# Patient Record
Sex: Female | Born: 1985 | Hispanic: Yes | Marital: Single | Smoking: Never smoker
Health system: Southern US, Community
[De-identification: ages and names within clinical notes are randomized; demographics above are authoritative.]

---

## 2017-07-01 ENCOUNTER — Emergency Department (HOSPITAL_COMMUNITY): Payer: BLUE CROSS/BLUE SHIELD

## 2017-07-01 ENCOUNTER — Encounter (HOSPITAL_COMMUNITY): Payer: Self-pay | Admitting: Emergency Medicine

## 2017-07-01 ENCOUNTER — Emergency Department (HOSPITAL_COMMUNITY)
Admission: EM | Admit: 2017-07-01 | Discharge: 2017-07-01 | Disposition: A | Discharge status: Home / Self Care | Payer: BLUE CROSS/BLUE SHIELD | Attending: Emergency Medicine | Admitting: Emergency Medicine

## 2017-07-01 DIAGNOSIS — R079 Chest pain, unspecified: Secondary | ICD-10-CM | POA: Diagnosis present

## 2017-07-01 DIAGNOSIS — R002 Palpitations: Secondary | ICD-10-CM | POA: Insufficient documentation

## 2017-07-01 DIAGNOSIS — M79604 Pain in right leg: Secondary | ICD-10-CM | POA: Insufficient documentation

## 2017-07-01 DIAGNOSIS — R000 Tachycardia, unspecified: Secondary | ICD-10-CM

## 2017-07-01 LAB — RAPID URINE DRUG SCREEN, HOSP PERFORMED
Amphetamines: NOT DETECTED
BARBITURATES: NOT DETECTED
BENZODIAZEPINES: NOT DETECTED
COCAINE: NOT DETECTED
Opiates: NOT DETECTED
TETRAHYDROCANNABINOL: NOT DETECTED

## 2017-07-01 LAB — I-STAT BETA HCG BLOOD, ED (MC, WL, AP ONLY)

## 2017-07-01 LAB — BASIC METABOLIC PANEL
Anion gap: 6 (ref 5–15)
BUN: 11 mg/dL (ref 6–20)
CALCIUM: 9 mg/dL (ref 8.9–10.3)
CHLORIDE: 107 mmol/L (ref 101–111)
CO2: 25 mmol/L (ref 22–32)
CREATININE: 0.82 mg/dL (ref 0.44–1.00)
GFR calc non Af Amer: >60 mL/min (ref >60)
Glucose, Bld: 130 mg/dL — ABNORMAL HIGH (ref 65–99)
Potassium: 4 mmol/L (ref 3.5–5.1)
SODIUM: 138 mmol/L (ref 135–145)

## 2017-07-01 LAB — CBC
HCT: 40.2 % (ref 36.0–46.0)
Hemoglobin: 13.1 g/dL (ref 12.0–15.0)
MCH: 27.7 pg (ref 26.0–34.0)
MCHC: 32.6 g/dL (ref 30.0–36.0)
MCV: 85 fL (ref 78.0–100.0)
PLATELETS: 323 10*3/uL (ref 150–400)
RBC: 4.73 MIL/uL (ref 3.87–5.11)
RDW: 14.6 % (ref 11.5–15.5)
WBC: 6.6 10*3/uL (ref 4.0–10.5)

## 2017-07-01 LAB — I-STAT TROPONIN, ED: TROPONIN I, POC: 0 ng/mL (ref 0.00–0.08)

## 2017-07-01 LAB — TSH: TSH: 0.973 u[IU]/mL (ref 0.350–4.500)

## 2017-07-01 MED ORDER — ACETAMINOPHEN 500 MG PO TABS
1000.0000 mg | ORAL_TABLET | Freq: Once | ORAL | Status: AC
  Administered 2017-07-01: 1000 mg via ORAL
  Filled 2017-07-01: qty 2

## 2017-07-01 MED ORDER — IOPAMIDOL (ISOVUE-370) INJECTION 76%
INTRAVENOUS | Status: AC
  Filled 2017-07-01: qty 100

## 2017-07-01 MED ORDER — GUAIFENESIN-CODEINE 100-10 MG/5ML PO SYRP: 5.0000 mL | ORAL_SOLUTION | Freq: Three times a day (TID) | ORAL | 0 refills | Status: AC | PRN

## 2017-07-01 MED ORDER — METOPROLOL TARTRATE 5 MG/5ML IV SOLN
5.0000 mg | Freq: Once | INTRAVENOUS | Status: AC
  Administered 2017-07-01: 5 mg via INTRAVENOUS
  Filled 2017-07-01: qty 5

## 2017-07-01 MED ORDER — IOPAMIDOL (ISOVUE-370) INJECTION 76%
100.0000 mL | Freq: Once | INTRAVENOUS | Status: AC | PRN
  Administered 2017-07-01: 100 mL via INTRAVENOUS

## 2017-07-01 MED ORDER — BENZONATATE 100 MG PO CAPS: 100.0000 mg | ORAL_CAPSULE | Freq: Three times a day (TID) | ORAL | 0 refills | Status: AC

## 2017-07-01 NOTE — ED Triage Notes (Signed)
Patient here from home with complaints of central chest pain non radiating and palpations . Denies n/v.

## 2017-07-01 NOTE — ED Provider Notes (Signed)
[facility]-EMERGENCY DEPT Provider Note   CSN: [id number] Arrival date & time: 07/01/17  0606     History   Chief Complaint Chief Complaint  Patient presents with  . Chest Pain  . Palpitations    HPI Alexis MaceMelissa Manning is a 32 y.o. female.  HPI   [name] is a 32 year old female with a history of palpitations, panic attacks and obesity who presents to the emergency department for evaluation of palpitations and chest pain.  Patient states that she developed cough and sneezing yesterday, last night took Alka-Seltzer cold and flu.  States that she has total body aches and chills as well. At about 5 AM this morning she woke up feeling as if her heart was beating out of her chest.  She also has associated left-sided chest pain below the left breast.  Pain feels like a "pinprick" sensation and does not radiate.  She rates pain is about 3/10 in severity and comes and goes. Her pain is worsened when she takes a deep breath. Also endorses right leg pain for the past two days, denies swelling or recent injury. She denies history of DVT/PE, exogenous estrogen use, long periods of immobility or recent surgery, hemoptysis.   Patient also endorses some shortness of breath and lightheadedness. She feels that her bilateral hands are "tingly," but denies loss of sensation. She denies headache, weakness, blurred vision, abdominal pain, N/V, diarrhea, dysuria, syncope.  Denies any recent life stressors. States that [place] she was worked up for palpitations and had a negative echocardiogram.  She states that she has never been on medication for palpitations.  Denies history of heart problems other than occasional palpitations. She denies recent drug use.   History reviewed. No pertinent past medical history.  There are no active problems to display for this patient.   History reviewed. No pertinent surgical history.  OB History    No data available        Home Medications    Prior to Admission medications   Not on File    Family History No family history on file.  Social History Social History   Tobacco Use  . Smoking status: Never Smoker  . Smokeless tobacco: Never Used  Substance Use Topics  . Alcohol use: Not on file  . Drug use: Not on file     Allergies   Patient has no allergy information on record.   Review of Systems Review of Systems  Constitutional: Positive for chills, fatigue and fever.  Eyes: Negative for visual disturbance.  Respiratory: Positive for cough and shortness of breath.   Cardiovascular: Positive for chest pain and palpitations. Negative for leg swelling.  Gastrointestinal: Negative for abdominal pain, nausea and vomiting.  Genitourinary: Negative for difficulty urinating and dysuria.  Musculoskeletal: Positive for myalgias (right leg pain).  Skin: Negative for rash.  Neurological: Positive for light-headedness. Negative for weakness, numbness and headaches.  Psychiatric/Behavioral: The patient is not nervous/anxious.      Physical Exam Updated Vital Signs BP (!) 137/96   Pulse (!) 115   Temp 100.2 F (37.9 C) (Oral)   Resp 17   Ht 5\' 2"  (1.575 m)   Wt 117.9 kg (260 lb)   LMP 06/27/2017   SpO2 100%   BMI 47.55 kg/m   Physical Exam  Constitutional: She is oriented to person, place, and time. She appears well-developed and well-nourished. No distress.  HENT:  Head: Normocephalic and atraumatic.  Mouth/Throat: Oropharynx is clear  and moist. No oropharyngeal exudate.  Eyes: Conjunctivae are normal. Pupils are equal, round, and reactive to light. Right eye exhibits no discharge. Left eye exhibits no discharge.  Neck: Normal range of motion. Neck supple.  Cardiovascular: Intact distal pulses. Exam reveals no friction rub.  No murmur heard. Regular rhythm, tachycardic.   Pulmonary/Chest: Effort normal and breath sounds normal. No stridor. No respiratory distress. She has no  wheezes. She has no rales. She exhibits no tenderness.  Abdominal: Soft. Bowel sounds are normal. There is no tenderness. There is no guarding.  Musculoskeletal: Normal range of motion.  No leg swelling or tenderness on exam. DP pulses 2+ bilaterally. Gait normal in balance and coordination.   Neurological: She is alert and oriented to person, place, and time. Coordination normal.  Distal sensation to light/sharp touch intact in bilateral UE.   Skin: Skin is warm and dry. Capillary refill takes less than 2 seconds. She is not diaphoretic.  Psychiatric: She has a normal mood and affect. Her behavior is normal.  Nursing note and vitals reviewed.    ED Treatments / Results  Labs (all labs ordered are listed, but only abnormal results are displayed) Labs Reviewed  CBC  BASIC METABOLIC PANEL  I-STAT TROPONIN, ED  I-STAT BETA HCG BLOOD, ED ([facility], [facility], [facility] ONLY)    EKG  EKG Interpretation  Date/Time:  Saturday July 01 2017 06:21:30 EST Ventricular Rate:  153 PR Interval:    QRS Duration: 79 QT Interval:  263 QTC Calculation: 420 R Axis:   75 Text Interpretation:  Sinus tachycardia Minimal ST depression, lateral leads - probably rate-related No old tracing to compare Confirmed by Dione Booze ([id number]) on 07/01/2017 6:27:46 AM       Radiology Dg Chest 2 View  Result Date: 07/01/2017 CLINICAL DATA:  Initial evaluation for acute chest pain. EXAM: CHEST  2 VIEW COMPARISON:  None. FINDINGS: The cardiac and mediastinal silhouettes are stable in size and contour, and remain within normal limits. The lungs are normally inflated. No airspace consolidation, pleural effusion, or pulmonary edema is identified. There is no pneumothorax. No acute osseous abnormality identified. IMPRESSION: No active cardiopulmonary disease. Electronically Signed   By: Rise Mu M.D.   On: 07/01/2017 06:46    Procedures Procedures (including critical care time)  Medications Ordered in ED Medications -  No data to display   Initial Impression / Assessment and Plan / ED Course  I have reviewed the triage vital signs and the nursing notes.  Pertinent labs & imaging results that were available during my care of the patient were reviewed by me and considered in my medical decision making (see chart for details).  Clinical Course as of Jul 01 905  Sat Jul 01, 2017  0905 After IV lopressor patient's pulse 101. She states her chest pain is about the same as previous.   [ES]    Clinical Course User Index [ES] Kellie Shropshire, PA-C   Patient presents with palpitations and chest pain. Recent sickness with cough, myalgias, fever/chills. States that she has a history of panic attacks and palpitations in the past, isn't sure if this is similar. Denies previous cardiac history.   On exam she is tachycardic to 131bpm, regular rhythm. She does not have any calf tenderness or swelling on exam, but states that she has had right leg tenderness for the past two days. No history of DVT/PE.   EKG shows sinus tachycardia. Troponin negative. BhCG negative. CBC and BMP unremarkable. TSH WNL. Rapid  UDS negative.    Given patient is tachycardic, tachypnic, SOB and complaining of CP will get CT angio to evaluate for PE. Will also give Lopressor IVP for tachycardia.  CT angio negative for PE. No pneumonia. She has a low grade fever of 100.2. Will treat her for flu-like symptoms. Tylenol for fever and motrin for body aches and pains. Also stressed the importance of fluid rehydration. Will dc with cough suppressant.   Counseled patient to follow up with primary care doctor and have given her information in discharge paperwork to establish care with PCP. Her blood sugar was elevated in the ED, counseled her to have this rechecked. Discussed return precautions and patient agrees and voices understanding to above plan. Discussed this patient with Dr. Particia Nearing who agrees with above plan.    Final Clinical  Impressions(s) / ED Diagnoses   Final diagnoses:  Tachycardia  Palpitations    ED Discharge Orders        Ordered    guaiFENesin-codeine [facility]) 100-10 MG/5ML syrup  3 times daily PRN     07/01/17 1334    benzonatate (TESSALON) 100 MG capsule  Every 8 hours     07/01/17 1334       Kellie Shropshire, PA-C 07/01/17 1634    Jacalyn Lefevre, MD 07/05/17 2312

## 2017-07-01 NOTE — Discharge Instructions (Signed)
Your blood work, xray and CT scan were reassuring.   Please schedule an appointment to establish care with a primary care doctor. I have listed the information to [facility] below. They are a primary care office across the street from [facility] . Please follow up on your palpitations and recheck your blood sugar which was elevated in the ER today.   Please take tylenol for fever and body aches. You can also take motrin to help with your body aches.   It is important to stay hydrated and drink plenty of fluids. I have written you a prescription for a cough syrup. It can make you drowsy so please do not drive, work or drink alcohol while taking it.   Return to the ER if you have worsening palpitations in which you feel dizzy, short of breath or like you are going to pass out. Please also return if you have worsening chest pain that radiates to the left arm or jaw or you have chest pain with nausea/vomiting and sweating.

## 2017-07-01 NOTE — ED Notes (Signed)
RN to attempt US IV.

## 2017-10-24 ENCOUNTER — Encounter (HOSPITAL_COMMUNITY): Payer: Self-pay

## 2017-10-24 DIAGNOSIS — R0602 Shortness of breath: Secondary | ICD-10-CM | POA: Diagnosis not present

## 2017-10-24 DIAGNOSIS — R42 Dizziness and giddiness: Secondary | ICD-10-CM | POA: Diagnosis present

## 2017-10-24 DIAGNOSIS — Z79899 Other long term (current) drug therapy: Secondary | ICD-10-CM | POA: Diagnosis not present

## 2017-10-25 ENCOUNTER — Emergency Department (HOSPITAL_COMMUNITY): Payer: BLUE CROSS/BLUE SHIELD

## 2017-10-25 ENCOUNTER — Emergency Department (HOSPITAL_COMMUNITY)
Admission: EM | Admit: 2017-10-25 | Discharge: 2017-10-25 | Disposition: A | Discharge status: Home / Self Care | Payer: BLUE CROSS/BLUE SHIELD | Attending: Emergency Medicine | Admitting: Emergency Medicine

## 2017-10-25 DIAGNOSIS — R42 Dizziness and giddiness: Secondary | ICD-10-CM

## 2017-10-25 DIAGNOSIS — R0602 Shortness of breath: Secondary | ICD-10-CM

## 2017-10-25 LAB — CBC WITH DIFFERENTIAL/PLATELET
Basophils Absolute: 0 10*3/uL (ref 0.0–0.1)
Basophils Relative: 0 %
EOS ABS: 0.1 10*3/uL (ref 0.0–0.7)
EOS PCT: 1 %
HCT: 38.9 % (ref 36.0–46.0)
Hemoglobin: 12.4 g/dL (ref 12.0–15.0)
Lymphocytes Relative: 33 %
Lymphs Abs: 3.1 10*3/uL (ref 0.7–4.0)
MCH: 27.6 pg (ref 26.0–34.0)
MCHC: 31.9 g/dL (ref 30.0–36.0)
MCV: 86.4 fL (ref 78.0–100.0)
MONO ABS: 0.6 10*3/uL (ref 0.1–1.0)
MONOS PCT: 7 %
Neutro Abs: 5.7 10*3/uL (ref 1.7–7.7)
Neutrophils Relative %: 59 %
PLATELETS: 340 10*3/uL (ref 150–400)
RBC: 4.5 MIL/uL (ref 3.87–5.11)
RDW: 14.9 % (ref 11.5–15.5)
WBC: 9.5 10*3/uL (ref 4.0–10.5)

## 2017-10-25 LAB — BASIC METABOLIC PANEL
Anion gap: 10 (ref 5–15)
BUN: 11 mg/dL (ref 6–20)
CHLORIDE: 107 mmol/L (ref 101–111)
CO2: 24 mmol/L (ref 22–32)
CREATININE: 0.87 mg/dL (ref 0.44–1.00)
Calcium: 9.4 mg/dL (ref 8.9–10.3)
GFR calc Af Amer: >60 mL/min (ref >60)
Glucose, Bld: 84 mg/dL (ref 65–99)
Potassium: 3.5 mmol/L (ref 3.5–5.1)
SODIUM: 141 mmol/L (ref 135–145)

## 2017-10-25 LAB — D-DIMER, QUANTITATIVE: D-Dimer, Quant: 0.27 ug/mL-FEU (ref 0.00–0.50)

## 2017-10-25 MED ORDER — AMOXICILLIN 500 MG PO CAPS: 500.0000 mg | ORAL_CAPSULE | Freq: Three times a day (TID) | ORAL | 0 refills | Status: AC

## 2017-10-25 MED ORDER — MECLIZINE HCL 25 MG PO TABS: 25.0000 mg | ORAL_TABLET | Freq: Two times a day (BID) | ORAL | 0 refills | Status: AC

## 2017-10-25 MED ORDER — MECLIZINE HCL 25 MG PO TABS
25.0000 mg | ORAL_TABLET | Freq: Once | ORAL | Status: AC
  Administered 2017-10-25: 25 mg via ORAL
  Filled 2017-10-25: qty 1

## 2017-10-25 NOTE — ED Provider Notes (Signed)
[facility]-EMERGENCY DEPT Provider Note   CSN: [id number] Arrival date & time: 10/24/17  2313     History   Chief Complaint Chief Complaint  Patient presents with  . Dizziness  . Shortness of Breath    HPI Alexis Manning is a 32 y.o. female.  Patient presents to the emergency department with a chief complaint of dizziness and shortness of breath.  She reports being dizzy x1 week.  She reports having ear fullness and ear pain.  She describes her dizziness as though the room were spinning.  She states that her symptoms do get worse when she moves her head and when she stands up.  She states that the symptoms wax and wane in severity.  She denies having any fevers or chills.  Additionally, patient complains of shortness of breath which she says she noticed today.  She recently returned from a trip from [place].  She drove to [place].  She denies any calf pain or leg swelling.  Denies any history of PE or DVT.  She denies any chest pain.  Denies any other associated symptoms.  She has not taken anything for her symptoms.  The history is provided by the patient. No language interpreter was used.    History reviewed. No pertinent past medical history.  There are no active problems to display for this patient.   History reviewed. No pertinent surgical history.   OB History   None      Home Medications    Prior to Admission medications   Medication Sig Start Date End Date Taking? Authorizing Provider  calcium carbonate (TUMS - DOSED IN MG ELEMENTAL CALCIUM) 500 MG chewable tablet Chew 1 tablet by mouth daily as needed for indigestion or heartburn.   Yes [provider]  cholecalciferol (VITAMIN D) 1000 units tablet Take 5,000 Units by mouth daily.   Yes [provider]  ibuprofen (ADVIL,MOTRIN) 200 MG tablet Take 400 mg by mouth every 6 (six) hours as needed for mild pain.   Yes [provider]  omeprazole (PRILOSEC OTC) 20 MG tablet  Take 20 mg by mouth daily.   Yes [provider]  benzonatate (TESSALON) 100 MG capsule Take 1 capsule (100 mg total) by mouth every 8 (eight) hours. Patient not taking: Reported on 10/25/2017 07/01/17   Kellie Shropshire, PA-C  guaiFENesin-codeine [facility]) 100-10 MG/5ML syrup Take 5 mLs by mouth 3 (three) times daily as needed for cough. Patient not taking: Reported on 10/25/2017 07/01/17   Kellie Shropshire, PA-C    Family History History reviewed. No pertinent family history.  Social History Social History   Tobacco Use  . Smoking status: Never Smoker  . Smokeless tobacco: Never Used  Substance Use Topics  . Alcohol use: Never    Frequency: Never  . Drug use: Never     Allergies   Other   Review of Systems Review of Systems  All other systems reviewed and are negative.    Physical Exam Updated Vital Signs BP 122/64 (BP Location: Left Arm)   Pulse 99   Temp 98 F (36.7 C) (Oral)   Resp (!) 24   Ht  (1.575 m)   Wt 122.5 kg (270 lb)   LMP 10/16/2017   SpO2 99%   BMI 49.38 kg/m   Physical Exam  Constitutional: She is oriented to person, place, and time. She appears well-developed and well-nourished.  HENT:  Head: Normocephalic and atraumatic.  Eyes: Pupils are equal, round,  and reactive to light. Conjunctivae and EOM are normal.  Neck: Normal range of motion. Neck supple.  Cardiovascular: Normal rate and regular rhythm. Exam reveals no gallop and no friction rub.  No murmur Manning. Pulmonary/Chest: Effort normal and breath sounds normal. No respiratory distress. She has no wheezes. She has no rales. She exhibits no tenderness.  Abdominal: Soft. Bowel sounds are normal. She exhibits no distension and no mass. There is no tenderness. There is no rebound and no guarding.  Musculoskeletal: Normal range of motion. She exhibits no edema or tenderness.  Neurological: She is alert and oriented to person, place, and time.  Skin: Skin is warm and dry.    Psychiatric: She has a normal mood and affect. Her behavior is normal. Judgment and thought content normal.  Nursing note and vitals reviewed.    ED Treatments / Results  Labs (all labs ordered are listed, but only abnormal results are displayed) Labs Reviewed - No data to display  EKG EKG Interpretation  Date/Time:  Tuesday October 24 2017 23:40:06 EDT Ventricular Rate:  110 PR Interval:    QRS Duration: 87 QT Interval:  319 QTC Calculation: 432 R Axis:   45 Text Interpretation:  Sinus tachycardia Minimal ST depression Since last tracing rate slower 01 Jul 2017 Confirmed by Devoria Albe ([id number]) on 10/25/2017 2:09:08 AM   Radiology No results found.  Procedures Procedures (including critical care time)  Medications Ordered in ED Medications  meclizine (ANTIVERT) tablet 25 mg (has no administration in time range)     Initial Impression / Assessment and Plan / ED Course  I have reviewed the triage vital signs and the nursing notes.  Pertinent labs & imaging results that were available during my care of the patient were reviewed by me and considered in my medical decision making (see chart for details).    Patient with vertiginous type dizziness.  She also complains of some ear fullness and ear pain.  May benefit from antibiotic therapy.  May need outpatient referral to ENT or neurology.  Regarding her shortness of breath, I am concerned about her recent long travel.  She is noted to be tachycardic in triage to 120.  Will check d-dimer.  D-dimer is negative.  Doubt PE.  Patient is not hypoxic.  Low risk for ACS.  Patient questions whether she may have seasonal allergies, this is certainly a possibility given the amount of pollen that is in the area right now.  I have encouraged her to pick up an antihistamine such as Zyrtec.  She agrees with this plan.  She has had some rhinitis, which could also be leading to her ear fullness and vertigo sensation.  Recommend PCP  follow-up.  Final Clinical Impressions(s) / ED Diagnoses   Final diagnoses:  Dizziness  Shortness of breath    ED Discharge Orders        Ordered    amoxicillin (AMOXIL) 500 MG capsule  3 times daily     10/25/17 0408    meclizine (ANTIVERT) 25 MG tablet  2 times daily     10/25/17 0408       Roxy Horseman, PA-C 10/25/17 6045    Devoria Albe, MD 10/25/17 [phone]

## 2017-10-31 ENCOUNTER — Other Ambulatory Visit: Payer: Self-pay | Admitting: Obstetrics and Gynecology

## 2019-01-20 ENCOUNTER — Emergency Department (HOSPITAL_COMMUNITY): Payer: BLUE CROSS/BLUE SHIELD

## 2019-01-20 ENCOUNTER — Other Ambulatory Visit: Payer: Self-pay

## 2019-01-20 ENCOUNTER — Emergency Department (HOSPITAL_COMMUNITY)
Admission: EM | Admit: 2019-01-20 | Discharge: 2019-01-20 | Disposition: A | Discharge status: Home / Self Care | Payer: BLUE CROSS/BLUE SHIELD | Attending: Emergency Medicine | Admitting: Emergency Medicine

## 2019-01-20 ENCOUNTER — Encounter (HOSPITAL_COMMUNITY): Payer: Self-pay

## 2019-01-20 DIAGNOSIS — R000 Tachycardia, unspecified: Secondary | ICD-10-CM | POA: Insufficient documentation

## 2019-01-20 DIAGNOSIS — R002 Palpitations: Secondary | ICD-10-CM | POA: Insufficient documentation

## 2019-01-20 DIAGNOSIS — Z79899 Other long term (current) drug therapy: Secondary | ICD-10-CM | POA: Insufficient documentation

## 2019-01-20 LAB — BASIC METABOLIC PANEL
Anion gap: 10 (ref 5–15)
BUN: 10 mg/dL (ref 6–20)
CO2: 23 mmol/L (ref 22–32)
Calcium: 9 mg/dL (ref 8.9–10.3)
Chloride: 108 mmol/L (ref 98–111)
Creatinine, Ser: 0.76 mg/dL (ref 0.44–1.00)
GFR calc Af Amer: >60 mL/min (ref >60)
GFR calc non Af Amer: >60 mL/min (ref >60)
Glucose, Bld: 105 mg/dL — ABNORMAL HIGH (ref 70–99)
Potassium: 4.1 mmol/L (ref 3.5–5.1)
Sodium: 141 mmol/L (ref 135–145)

## 2019-01-20 LAB — CBC
HCT: 39.1 % (ref 36.0–46.0)
Hemoglobin: 12.1 g/dL (ref 12.0–15.0)
MCH: 26.9 pg (ref 26.0–34.0)
MCHC: 30.9 g/dL (ref 30.0–36.0)
MCV: 87.1 fL (ref 80.0–100.0)
Platelets: 380 10*3/uL (ref 150–400)
RBC: 4.49 MIL/uL (ref 3.87–5.11)
RDW: 15 % (ref 11.5–15.5)
WBC: 13.1 10*3/uL — ABNORMAL HIGH (ref 4.0–10.5)
nRBC: 0 % (ref 0.0–0.2)

## 2019-01-20 LAB — RAPID URINE DRUG SCREEN, HOSP PERFORMED
Amphetamines: NOT DETECTED
Barbiturates: NOT DETECTED
Benzodiazepines: NOT DETECTED
Cocaine: NOT DETECTED
Opiates: NOT DETECTED
Tetrahydrocannabinol: NOT DETECTED

## 2019-01-20 LAB — I-STAT BETA HCG BLOOD, ED (NOT ORDERABLE): I-stat hCG, quantitative: <5 m[IU]/mL (ref <5)

## 2019-01-20 LAB — URINALYSIS, ROUTINE W REFLEX MICROSCOPIC
Bilirubin Urine: NEGATIVE
Glucose, UA: NEGATIVE mg/dL
Hgb urine dipstick: NEGATIVE
Ketones, ur: NEGATIVE mg/dL
Leukocytes,Ua: NEGATIVE
Nitrite: NEGATIVE
Protein, ur: NEGATIVE mg/dL
Specific Gravity, Urine: 1.008 (ref 1.005–1.030)
pH: 6 (ref 5.0–8.0)

## 2019-01-20 LAB — TROPONIN I (HIGH SENSITIVITY): Troponin I (High Sensitivity): 4 ng/L (ref <18)

## 2019-01-20 MED ORDER — SODIUM CHLORIDE 0.9 % IV BOLUS
1000.0000 mL | Freq: Once | INTRAVENOUS | Status: AC
  Administered 2019-01-20: 1000 mL via INTRAVENOUS

## 2019-01-20 MED ORDER — SODIUM CHLORIDE 0.9% FLUSH: 3.0000 mL | Freq: Once | INTRAVENOUS | Status: DC

## 2019-01-20 NOTE — ED Triage Notes (Signed)
Patient states that she began feeling "funny" at 1600 today. Patient state she looked at her Apple watch and her HR was 162 . Patient called EMS and when they arrived her EKG was OK, but suggested that she come to the ED to be checked out. Patient states when she was en route to the ED she had SOB.  Patient also c/o pressure behind her eyes.

## 2019-01-20 NOTE — ED Notes (Signed)
Patient transported to X-ray 

## 2019-01-20 NOTE — Discharge Instructions (Addendum)
Your laboratory results, EKG, chest x-ray were within normal limits.  I have attached the number to cardiology, please schedule an appointment for further management of your tachycardia.  Please return to the emergency department if you experience any chest pain, shortness of breath, worsening symptoms.

## 2019-01-20 NOTE — ED Provider Notes (Signed)
[facility]-EMERGENCY DEPT Provider Note   CSN: [id number] Arrival date & time: 01/20/19  1735    History   Chief Complaint Chief Complaint  Patient presents with  . Tachycardia    HPI Alexis Manning is a 33 y.o. female.     33 y.o female with a PMH of Palpitations presents to the ED with complaints of palpitations that began 4 hours ago. 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  Patient also reports feeling some blurry vision during this episode, called EMS and was transferred here.  She does report she has been taking Flonase for her allergies, using this daily.  Patient reports a similar episode 2 years ago, was seen in the ED, did not follow-up with cardiology.  She denies any chest pain, shortness of breath, abdominal pain, prior history of blood clots such as DVT or PE.  Of note, patient does report going to urgent care a couple of days ago, was told she might have a UTI, was placed on antibiotics states she has completed 3-4 doses, has not received a call from them as far as her results of the urinalysis.  Denies any fever while at home, urinary complaints on today's visit.  The history is provided by the patient and medical records.    History reviewed. No pertinent past medical history.  There are no active problems to display for this patient.   History reviewed. No pertinent surgical history.   OB History   No obstetric history on file.      Home Medications    Prior to Admission medications   Medication Sig Start Date End Date Taking? Authorizing Provider  calcium carbonate (TUMS - DOSED IN  MG ELEMENTAL CALCIUM) 500 MG chewable tablet Chew 1 tablet by mouth daily as needed for indigestion or heartburn.   Yes [provider]  cholecalciferol (VITAMIN D) 1000 units tablet Take 5,000 Units by mouth daily.   Yes [provider]  ECHINACEA EXTRACT PO Take 1 capsule by mouth daily.   Yes [provider]  fluticasone (FLONASE) 50 MCG/ACT nasal spray Place 1 spray into both nostrils daily.   Yes [provider]  ibuprofen (ADVIL,MOTRIN) 200 MG tablet Take 400 mg by mouth every 6 (six) hours as needed for mild pain.   Yes [provider]  loratadine (CLARITIN) 10 MG tablet Take 10 mg by mouth daily.   Yes [provider]  amoxicillin (AMOXIL) 500 MG capsule Take 1 capsule (500 mg total) by mouth 3 (three) times daily. Patient not taking: Reported on 01/20/2019 10/25/17   Roxy HorsemanBrowning, Robert, PA-C  benzonatate (TESSALON) 100 MG capsule Take 1 capsule (100 mg total) by mouth every 8 (eight) hours. Patient not taking: Reported on 10/25/2017 07/01/17   Kellie ShropshireShrosbree, Emily J, PA-C  guaiFENesin-codeine [facility](ROBITUSSIN AC) 100-10 MG/5ML syrup Take 5 mLs by mouth 3 (three) times daily as needed for cough. Patient not taking: Reported on 10/25/2017 07/01/17   Kellie ShropshireShrosbree, Emily J, PA-C  meclizine (ANTIVERT) 25 MG tablet Take 1 tablet (25 mg total) by mouth 2 (two) times daily. Patient not taking: Reported on 01/20/2019  10/25/17   Montine Circle, PA-C    Family History Family History  Problem Relation Age of Onset  . Healthy Mother   . Healthy Father     Social History Social History   Tobacco Use  . Smoking status: Never Smoker  . Smokeless tobacco: Never Used  Substance Use Topics  . Alcohol use: Never    Frequency: Never  . Drug use: Never     Allergies   Other   Review of Systems Review of Systems  Constitutional: Negative for chills and fever.  HENT: Negative for ear pain and sore throat.   Eyes: Negative for pain and visual disturbance.   Respiratory: Negative for cough, shortness of breath and wheezing.   Cardiovascular: Positive for palpitations. Negative for chest pain.  Gastrointestinal: Negative for abdominal pain and vomiting.  Genitourinary: Negative for dysuria and hematuria.  Musculoskeletal: Negative for arthralgias and back pain.  Skin: Negative for color change and rash.  Neurological: Negative for seizures and syncope.  All other systems reviewed and are negative.    Physical Exam Updated Vital Signs BP (!) 160/102 (BP Location: Right Arm)   Pulse 99   Temp 98 F (36.7 C) (Oral)   Resp 13   Ht 5\' 2"  (1.575 m)   Wt 127 kg   LMP 01/08/2019   SpO2 100%   BMI 51.21 kg/m   Physical Exam Vitals signs and nursing note reviewed.  Constitutional:      General: She is not in acute distress.    Appearance: She is well-developed.     Comments: Non-ill-appearing.  HENT:     Head: Normocephalic and atraumatic.     Mouth/Throat:     Pharynx: No oropharyngeal exudate.  Eyes:     Pupils: Pupils are equal, round, and reactive to light.  Neck:     Musculoskeletal: Normal range of motion and neck supple.  Cardiovascular:     Rate and Rhythm: Regular rhythm. Tachycardia present.     Heart sounds: Normal heart sounds.     Comments: No bilateral leg pitting edema. Pulmonary:     Effort: Pulmonary effort is normal. No respiratory distress.     Breath sounds: Normal breath sounds.     Comments: Lungs are clear to auscultation. Abdominal:     General: Bowel sounds are normal. There is no distension.     Palpations: Abdomen is soft.     Tenderness: There is no abdominal tenderness. There is no right CVA tenderness or left CVA tenderness.     Comments: Abdomen appears soft, no focal point of tenderness.  No tenderness on palpation.  Musculoskeletal:        General: No tenderness or deformity.     Right lower leg: No edema.     Left lower leg: No edema.  Skin:    General: Skin is warm and dry.  Neurological:      Mental Status: She is alert and oriented to person, place, and time.     Comments: No facial asymmetry, dysarthria, weakness noted on exam.      ED Treatments / Results  Labs (all labs ordered are listed, but only abnormal results are displayed) Labs Reviewed  BASIC METABOLIC PANEL - Abnormal; Notable for the following components:      Result Value   Glucose, Bld 105 (*)    All other components within normal limits  CBC - Abnormal; Notable for the following components:   WBC 13.1 (*)    All other components  within normal limits  URINALYSIS, ROUTINE W REFLEX MICROSCOPIC - Abnormal; Notable for the following components:   Color, Urine STRAW (*)    All other components within normal limits  RAPID URINE DRUG SCREEN, HOSP PERFORMED  I-STAT BETA HCG BLOOD, ED ([facility], [facility], [facility] ONLY)  I-STAT BETA HCG BLOOD, ED (NOT ORDERABLE)  TROPONIN I (HIGH SENSITIVITY)  TROPONIN I (HIGH SENSITIVITY)    EKG EKG Interpretation  Date/Time:  Sunday January 20 2019 17:48:08 EDT Ventricular Rate:  108 PR Interval:    QRS Duration: 78 QT Interval:  302 QTC Calculation: 405 R Axis:   26 Text Interpretation:  Sinus tachycardia Probable left atrial enlargement Confirmed by Raeford RazorKohut, Stephen [id number]) on 01/20/2019 8:07:38 PM   Radiology Dg Chest 2 View  Result Date: 01/20/2019 CLINICAL DATA:  Tachycardia EXAM: CHEST - 2 VIEW COMPARISON:  10/25/2017 FINDINGS: The heart size and mediastinal contours are within normal limits. Both lungs are clear. The visualized skeletal structures are unremarkable. IMPRESSION: No acute abnormality of the lungs. Electronically Signed   By: Lauralyn PrimesAlex  Bibbey M.D.   On: 01/20/2019 18:26    Procedures Procedures (including critical care time)  Medications Ordered in ED Medications  sodium chloride flush (NS) 0.9 % injection 3 mL (3 mLs Intravenous Not Given 01/20/19 2006)  sodium chloride 0.9 % bolus 1,000 mL (0 mLs Intravenous Stopped 01/20/19 2215)     Initial Impression /  Assessment and Plan / ED Course  I have reviewed the triage vital signs and the nursing notes.  Pertinent labs & imaging results that were available during my care of the patient were reviewed by me and considered in my medical decision making (see chart for details).  Patient with no pertinent past medical history presents to the ED with a chief complaint of palpitations, she reports this began around 4:00 while she was at work, states she checked her apple watch and was noted to have a heart rate of the 160s.  Reports she felt funny feeling in her heart at this time.  She also endorses some blurry vision during an episode, this has all resolved.  During primary evaluation patient arrived in the ED with a heart rate in the teens, EKG was obtained on arrival, D showed no changes consistent with infarct, STEMI.  No delta wave, low suspicion for any WPW.  Patient does report a similar episode 2 years ago, unknown cause.  Reports she never follow-up with cardiology.  She also reports has been using Flonase consistently daily for her sinus congestion.  UDS was obtained which showed no benzos, THC, barbiturates.  UA was negative for any nitrites, leukocytes, white blood cell count. Labs were within normal limits.  BMP showed no electrolyte derangement, current level is within normal limits.  CBC showed slight leukocytosis at 13.1, she denies any shortness of breath, chest pain, upper respiratory symptoms.  No prior history of seizures.  Chest x-ray was clear without any consolidation, pneumothorax, pleural effusion.  Patient was given a liter of fluids, blood pressure improved significantly, heart rate has come down to around 99.  Patient did ask about blood clots, she does not have any previous history of this, does not smoke, is currently not on any opposed estrogen.  She has no hypoxia, no leg swelling, no recent surgery, no prior history of DVT.  Low suspicion for pulmonary embolism at this time.   Discussed with patient that she will need to follow-up with cardiology for these recurrent palpitations, patient reports she will do  so.  Otherwise feeling asymptomatic at this time, will provide her with a work note to return on Tuesday.  Patient understands and agrees with management, stable afebrile without any further complaints.  Portions of this note were generated with Scientist, clinical (histocompatibility and immunogenetics)Dragon dictation software. Dictation errors may occur despite best attempts at proofreading.  Final Clinical Impressions(s) / ED Diagnoses   Final diagnoses:  Tachycardia  Palpitations    ED Discharge Orders    None       Freddy JakschSoto, Balraj Brayfield, PA-C 01/20/19 2336    Raeford RazorKohut, Stephen, MD 01/20/19 2348

## 2019-06-03 ENCOUNTER — Other Ambulatory Visit: Payer: Self-pay

## 2019-06-03 DIAGNOSIS — Z20822 Contact with and (suspected) exposure to covid-19: Secondary | ICD-10-CM

## 2019-06-04 LAB — NOVEL CORONAVIRUS, NAA: SARS-CoV-2, NAA: NOT DETECTED

## 2019-06-07 IMAGING — CT CT ANGIO CHEST
2 of 6 series · 19 of 36 positions shown · IV contrast (ISOVUE)
Comparison: None.

CLINICAL DATA: Chest pain, palpitations

EXAM:
CT ANGIOGRAPHY CHEST WITH CONTRAST
TECHNIQUE: Multidetector CT imaging of the chest was performed using the
standard protocol during bolus administration of intravenous
contrast. Multiplanar CT image reconstructions and MIPs were
obtained to evaluate the vascular anatomy.
CONTRAST:  100mL [id number] IOPAMIDOL ([id number]) INJECTION 76%

[Series 6: thins · axial · 0.72mm/px · z∈[+52,+276]mm · 18 of 250 slices shown]
[im 13/250  lung]
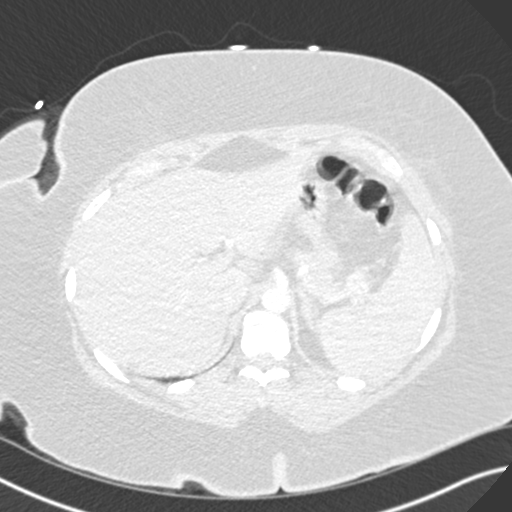
[im 25/250  mediastinal]
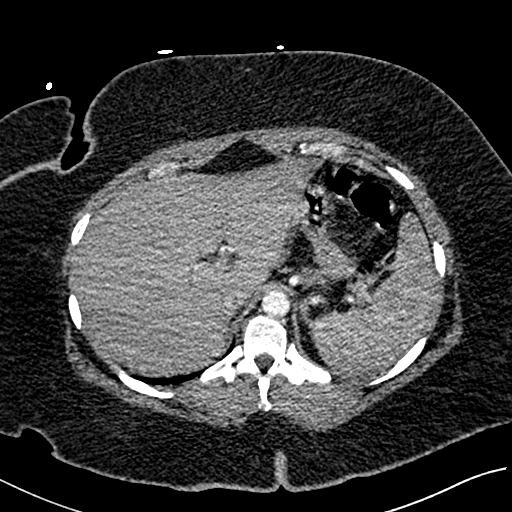
[im 38/250  lung]
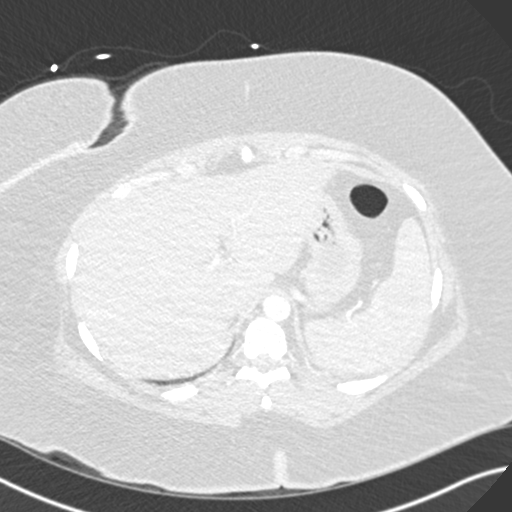
[im 50/250  mediastinal]
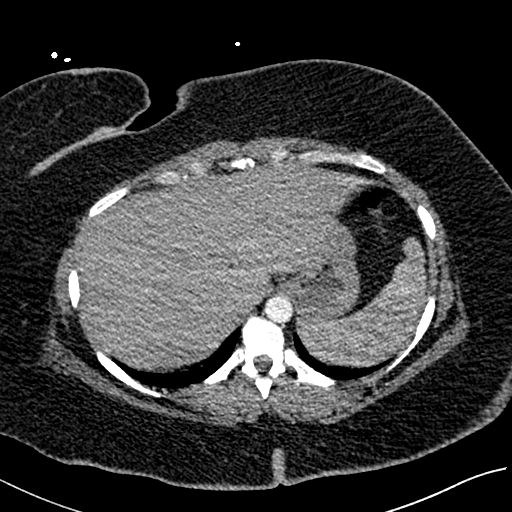
[im 63/250  lung]
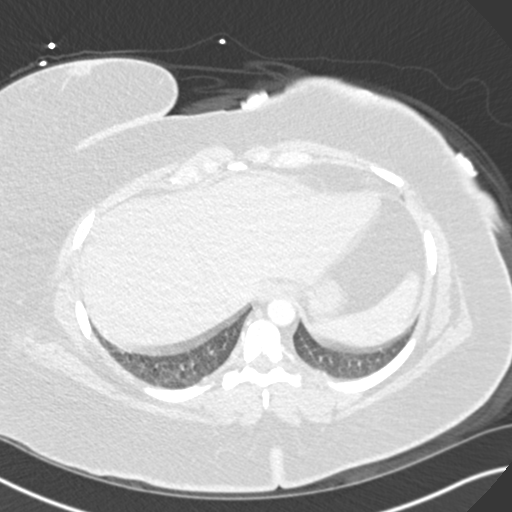
[im 75/250  mediastinal]
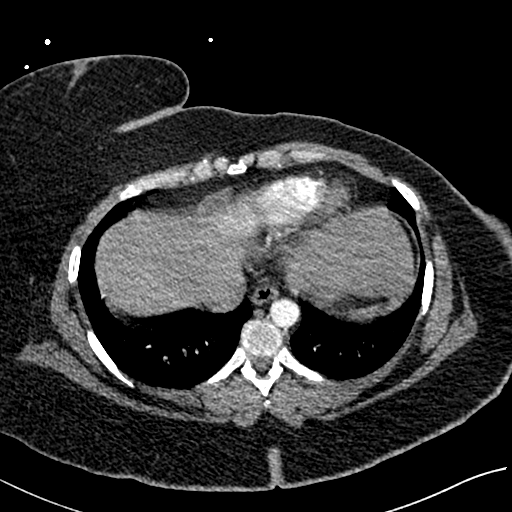
[im 88/250  lung]
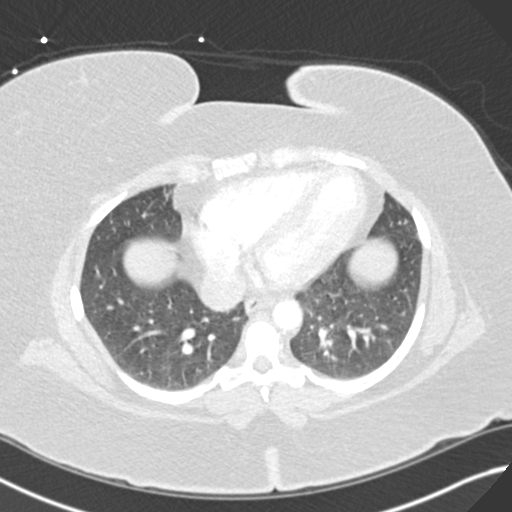
[im 100/250  mediastinal]
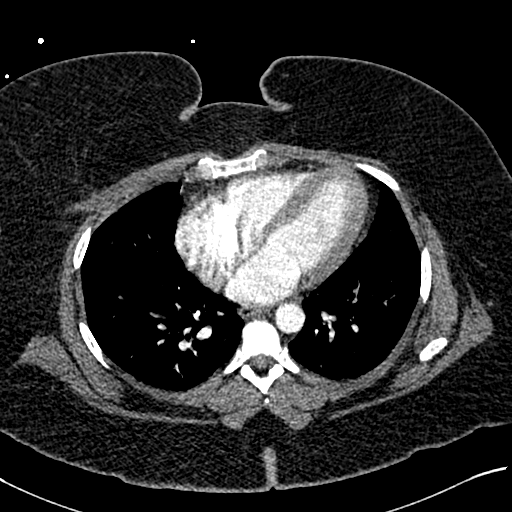
[im 113/250  lung]
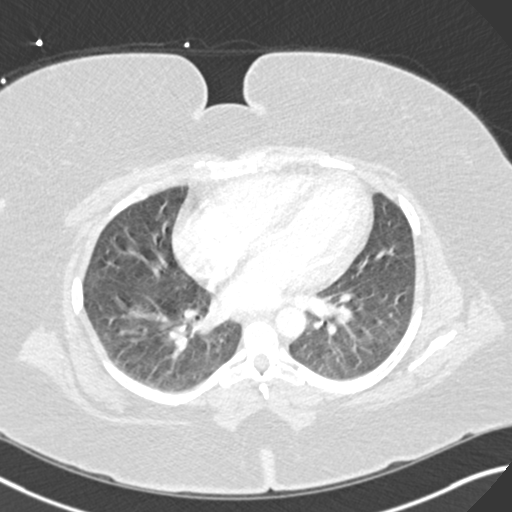
[im 137/250  mediastinal]
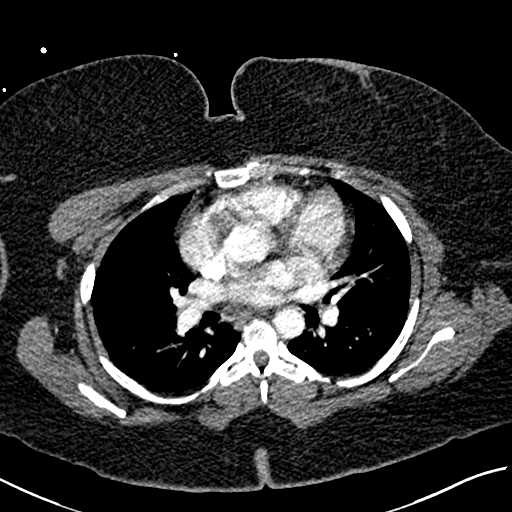
[im 150/250  lung]
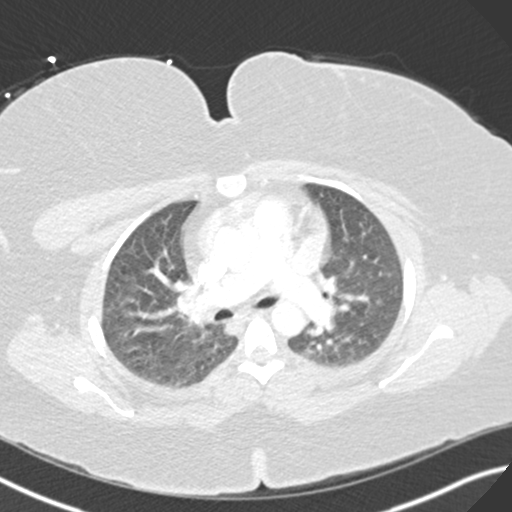
[im 162/250  mediastinal]
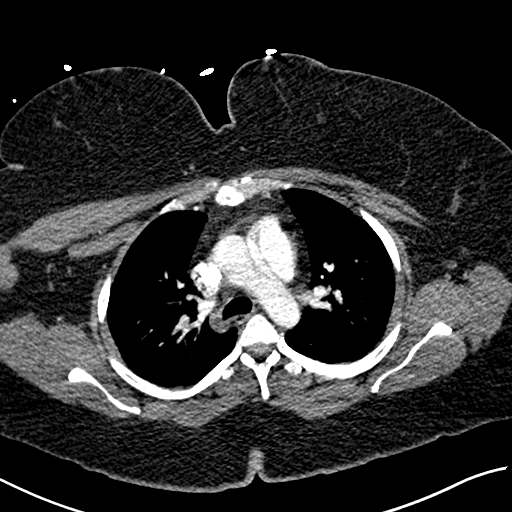
[im 175/250  lung]
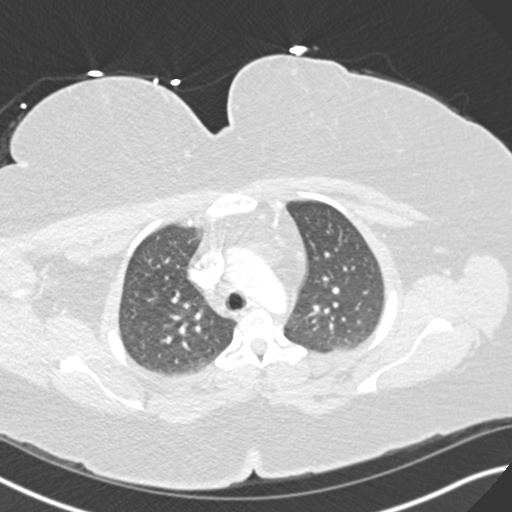
[im 187/250  mediastinal]
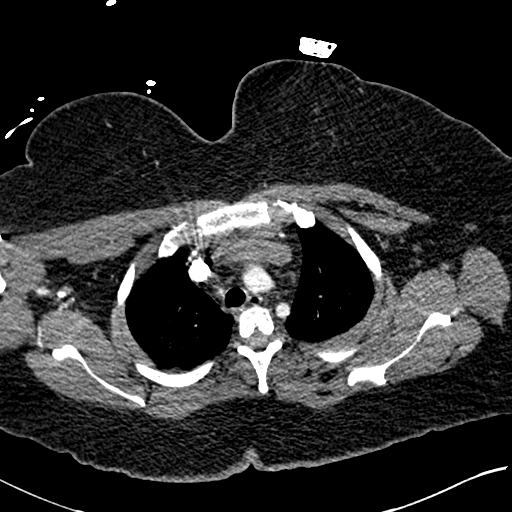
[im 200/250  lung]
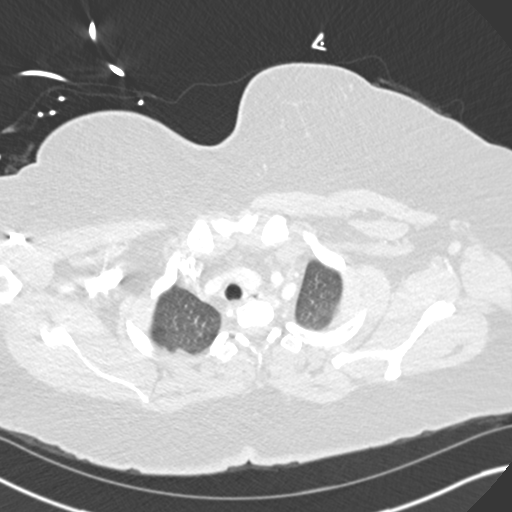
[im 212/250  mediastinal]
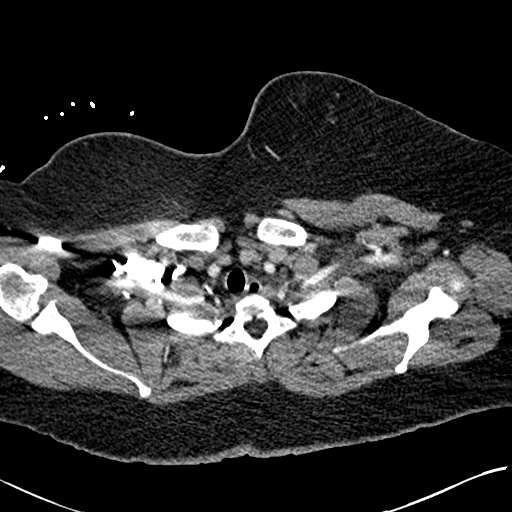
[im 225/250  lung]
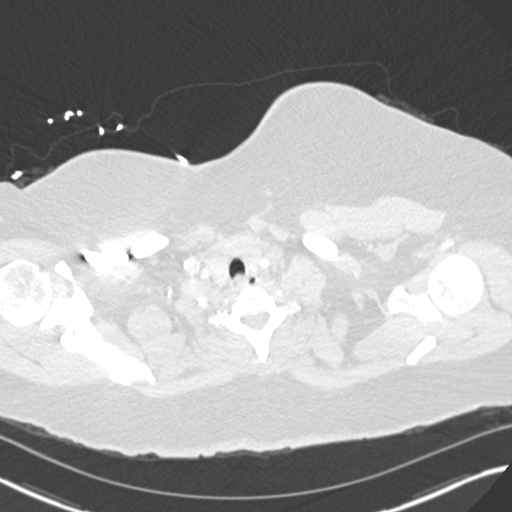
[im 237/250  mediastinal]
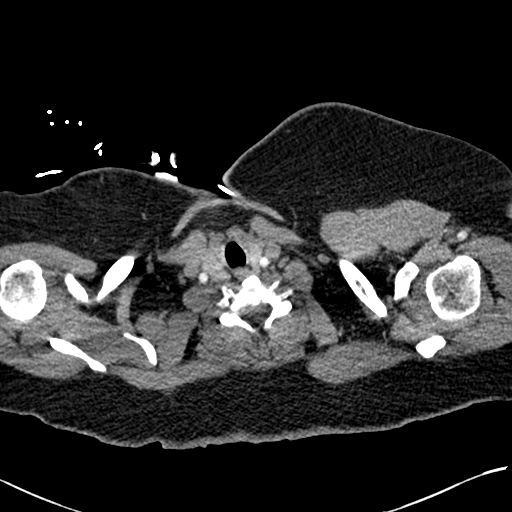

[Series 7: coronal mpr · coronal · 0.48mm/px · 1 of 151 slices shown]
[im 76/151  mediastinal]
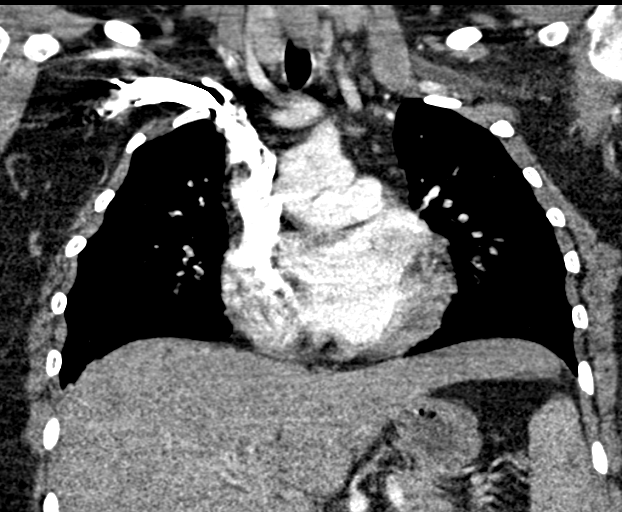

[19 of 36 positions shown; findings below may reference images not displayed]

FINDINGS: Cardiovascular: Satisfactory opacification the bilateral pulmonary
arteries to the segmental level. However, evaluation of the
bilateral pulmonary arteries is mildly constrained by respiratory
motion. Within that constraint, there is no evidence of pulmonary
embolism.

No evidence of thoracic aortic aneurysm or dissection.

The heart is normal in size.  No pericardial effusion.

Mediastinum/Nodes: No suspicious mediastinal lymphadenopathy.

Visualized thyroid is unremarkable.

Lungs/Pleura: Evaluation of the lung parenchyma is constrained by
respiratory motion.

Within that constraint, there are no suspicious pulmonary nodules.

No focal consolidation.

No pleural effusion or pneumothorax.

Upper Abdomen: Visualized upper abdomen is unremarkable.

Musculoskeletal: Visualized osseous structures are within normal
limits.

Review of the MIP images confirms the above findings.
IMPRESSION: No evidence of pulmonary embolism.

Normal CT chest.

## 2019-10-01 IMAGING — CR DG CHEST 2V
2 series · 2 of 2 positions shown · non-contrast
Comparison: 07/01/2017 chest radiograph

CLINICAL DATA: Shortness of breath

EXAM:
CHEST - 2 VIEW

[w chest pa]
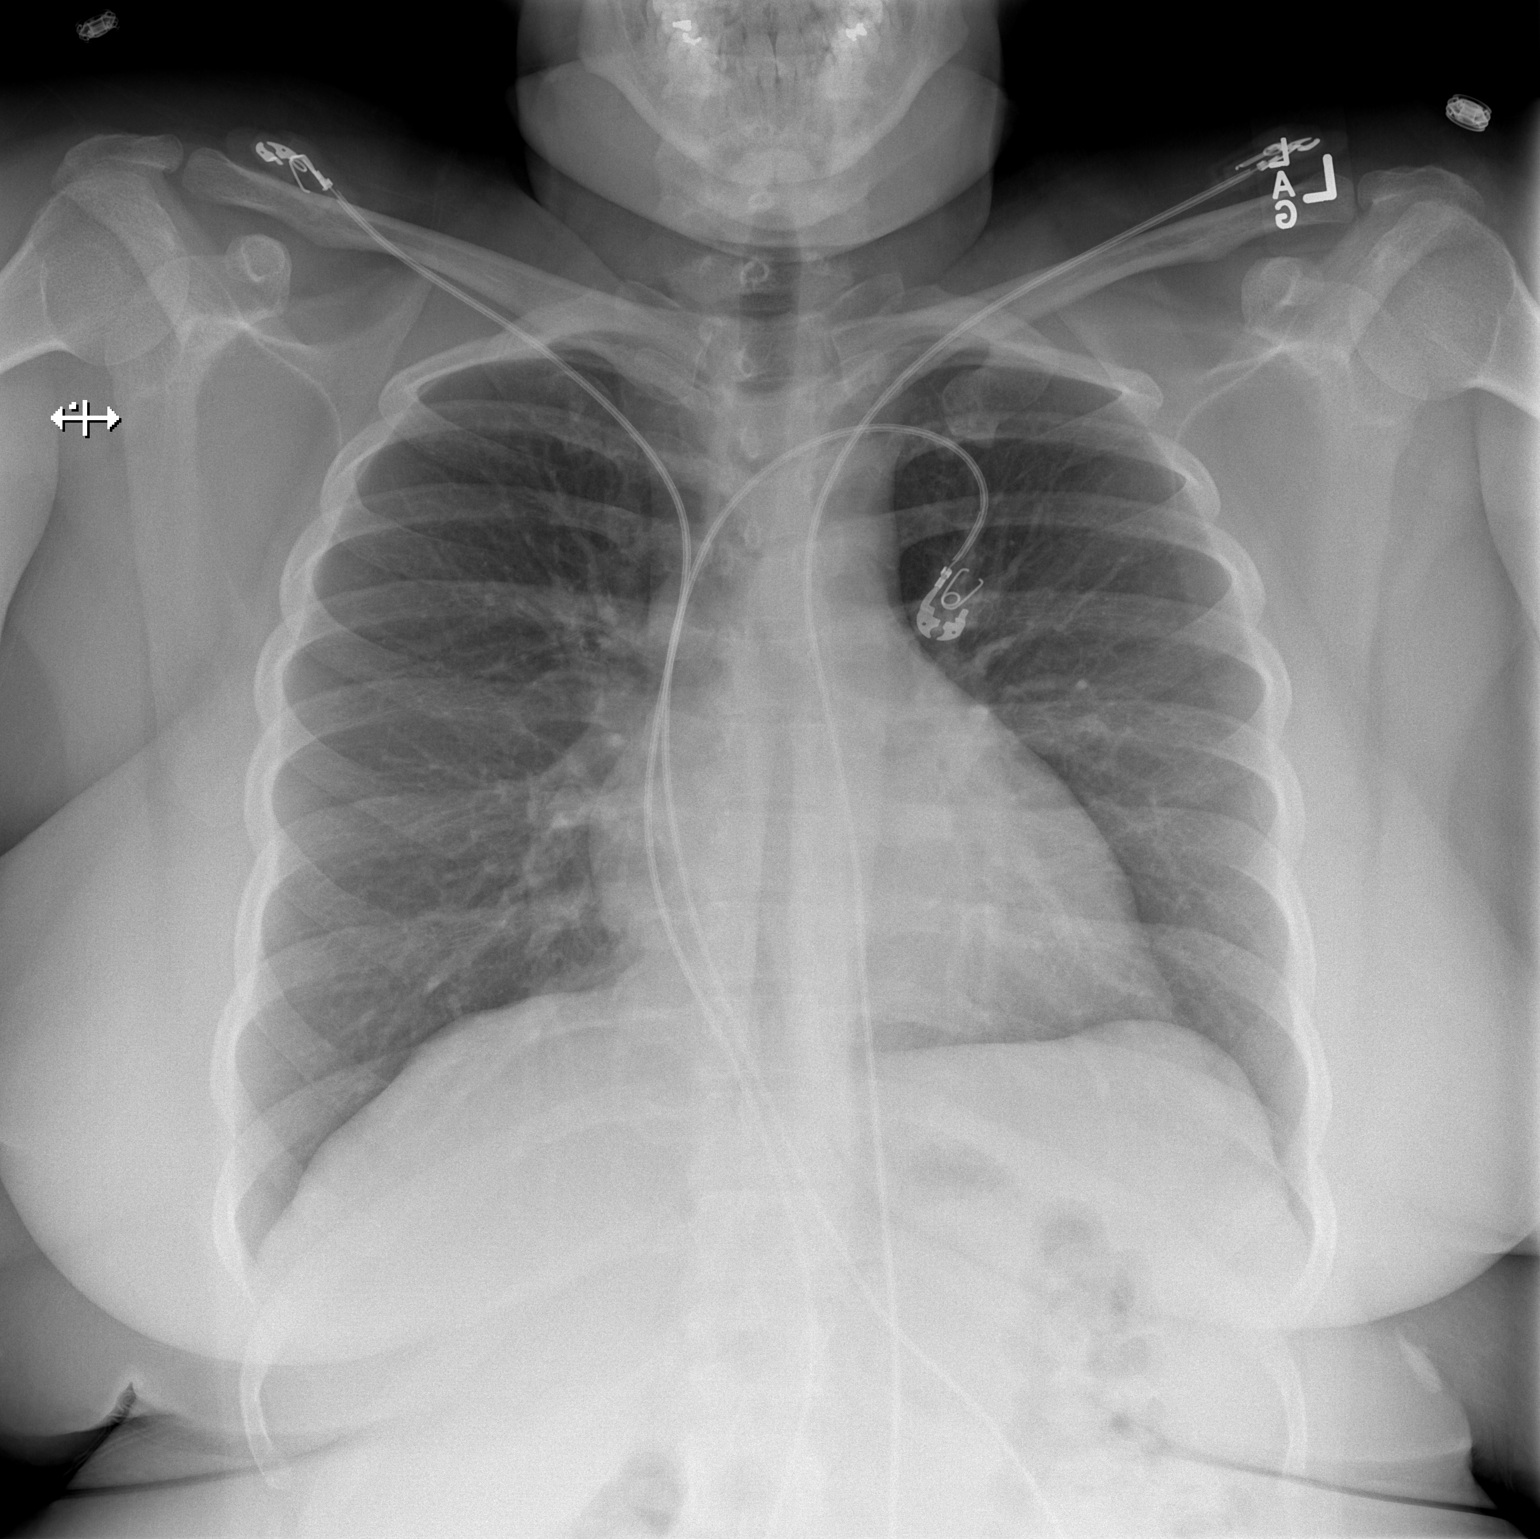

[w chest lat]
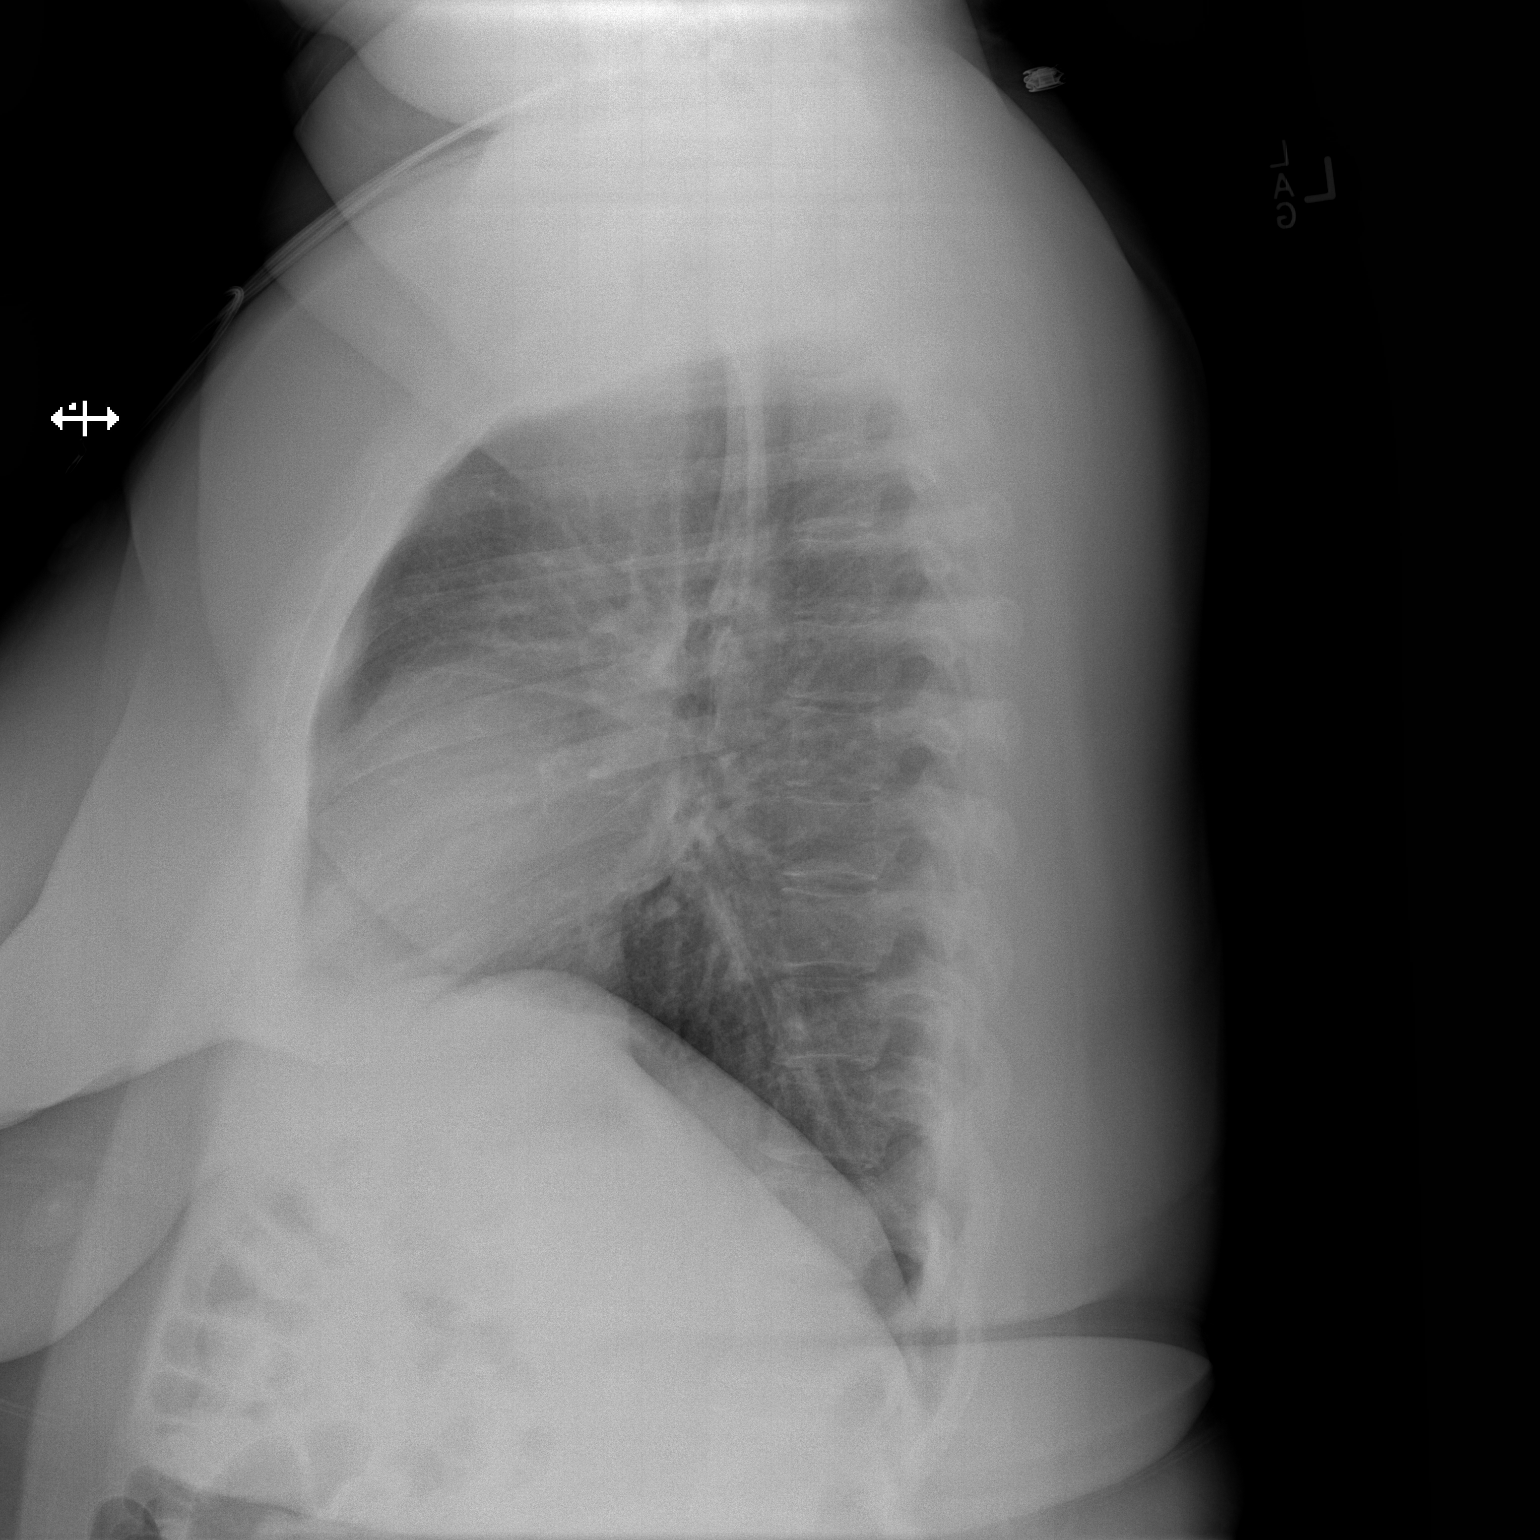

[2 of 2 positions shown; findings below may reference images not displayed]

FINDINGS: The heart size and mediastinal contours are within normal limits.
Both lungs are clear. The visualized skeletal structures are
unremarkable.
IMPRESSION: No active cardiopulmonary disease.
